# Patient Record
Sex: Female | Born: 1975
Health system: Southern US, Community
[De-identification: ages and names within clinical notes are randomized; demographics above are authoritative.]

## PROBLEM LIST (undated history)

## (undated) DIAGNOSIS — R87619 Unspecified abnormal cytological findings in specimens from cervix uteri: Secondary | ICD-10-CM

## (undated) DIAGNOSIS — IMO0002 Reserved for concepts with insufficient information to code with codable children: Secondary | ICD-10-CM

## (undated) HISTORY — PX: TONSILLECTOMY: SUR1361

## (undated) HISTORY — PX: CLAVICLE SURGERY: SHX598

---

## 2010-11-24 NOTE — L&D Delivery Note (Signed)
Delivery Note At 8:12 PM a viable female, "Lisa Pearson",  was delivered via Vaginal, Spontaneous Delivery (Presentation: Left Occiput Anterior, compound presentation with left hand).  APGAR: 8, 9; weight 6 lb 13 oz (3090 g).  FOB assisted at birth. Placenta status: Intact, Spontaneous.  Cord: 3 vessels with the following complications: Loose CAN x 1, delivered through loose cord.  Cord pH: NA  Anesthesia: Local for repair Episiotomy: None Lacerations: Left Labial;1st degree;Perineal Suture Repair: 2.0 3.0 vicryl Est. Blood Loss (mL): 200  Mom to postpartum.  Baby to nursery-stable, with skin to skin after delivery. Circumcision planned.  Nigel Bridgeman 10/03/2011, 9:03 PM

## 2011-04-03 LAB — RUBELLA ANTIBODY, IGM: Rubella: IMMUNE

## 2011-04-03 LAB — ANTIBODY SCREEN: Antibody Screen: NEGATIVE

## 2011-04-03 LAB — HIV ANTIBODY (ROUTINE TESTING W REFLEX): HIV: NONREACTIVE

## 2011-04-03 LAB — HEPATITIS B SURFACE ANTIGEN: Hepatitis B Surface Ag: NEGATIVE

## 2011-04-03 LAB — CBC: Platelets: 273 10*3/uL (ref 150–399)

## 2011-09-04 ENCOUNTER — Other Ambulatory Visit (HOSPITAL_COMMUNITY): Payer: Self-pay | Admitting: *Deleted

## 2011-09-05 ENCOUNTER — Other Ambulatory Visit (HOSPITAL_COMMUNITY): Payer: Self-pay | Admitting: *Deleted

## 2011-09-05 ENCOUNTER — Other Ambulatory Visit (HOSPITAL_COMMUNITY): Payer: Self-pay | Admitting: Obstetrics and Gynecology

## 2011-09-08 ENCOUNTER — Encounter (HOSPITAL_COMMUNITY): Payer: Self-pay

## 2011-09-08 ENCOUNTER — Ambulatory Visit (HOSPITAL_COMMUNITY)
Admission: RE | Admit: 2011-09-08 | Discharge: 2011-09-08 | Disposition: A | Discharge status: Home / Self Care | Payer: 59 | Source: Ambulatory Visit | Attending: Obstetrics and Gynecology | Admitting: Obstetrics and Gynecology

## 2011-09-08 ENCOUNTER — Other Ambulatory Visit (HOSPITAL_COMMUNITY): Payer: Self-pay | Admitting: Obstetrics and Gynecology

## 2011-09-08 DIAGNOSIS — O0930 Supervision of pregnancy with insufficient antenatal care, unspecified trimester: Secondary | ICD-10-CM | POA: Insufficient documentation

## 2011-09-08 DIAGNOSIS — O358XX0 Maternal care for other (suspected) fetal abnormality and damage, not applicable or unspecified: Secondary | ICD-10-CM | POA: Insufficient documentation

## 2011-09-09 ENCOUNTER — Ambulatory Visit (HOSPITAL_COMMUNITY): Payer: Self-pay

## 2011-09-11 LAB — STREP B DNA PROBE: GBS: POSITIVE

## 2011-09-26 ENCOUNTER — Encounter (HOSPITAL_COMMUNITY): Payer: Self-pay | Admitting: *Deleted

## 2011-09-26 ENCOUNTER — Inpatient Hospital Stay (HOSPITAL_COMMUNITY)
Admission: RE | Admit: 2011-09-26 | Discharge: 2011-09-26 | Disposition: A | Discharge status: Home / Self Care | Payer: 59 | Source: Ambulatory Visit | Attending: Obstetrics and Gynecology | Admitting: Obstetrics and Gynecology

## 2011-09-26 ENCOUNTER — Other Ambulatory Visit: Payer: Self-pay | Admitting: Obstetrics and Gynecology

## 2011-09-26 DIAGNOSIS — O99820 Streptococcus B carrier state complicating pregnancy: Secondary | ICD-10-CM

## 2011-09-26 DIAGNOSIS — O368190 Decreased fetal movements, unspecified trimester, not applicable or unspecified: Secondary | ICD-10-CM | POA: Insufficient documentation

## 2011-09-26 DIAGNOSIS — R42 Dizziness and giddiness: Secondary | ICD-10-CM | POA: Insufficient documentation

## 2011-09-26 DIAGNOSIS — O99891 Other specified diseases and conditions complicating pregnancy: Secondary | ICD-10-CM | POA: Insufficient documentation

## 2011-09-26 HISTORY — DX: Unspecified abnormal cytological findings in specimens from cervix uteri: R87.619

## 2011-09-26 HISTORY — DX: Reserved for concepts with insufficient information to code with codable children: IMO0002

## 2011-09-26 LAB — CBC
Hemoglobin: 12.3 g/dL (ref 12.0–15.0)
MCH: 31.1 pg (ref 26.0–34.0)
RBC: 3.96 MIL/uL (ref 3.87–5.11)

## 2011-09-26 LAB — COMPREHENSIVE METABOLIC PANEL
ALT: 6 U/L (ref 0–35)
Alkaline Phosphatase: 127 U/L — ABNORMAL HIGH (ref 39–117)
CO2: 23 mEq/L (ref 19–32)
Chloride: 105 mEq/L (ref 96–112)
GFR calc Af Amer: >90 mL/min (ref >90)
GFR calc non Af Amer: >90 mL/min (ref >90)
Glucose, Bld: 85 mg/dL (ref 70–99)
Potassium: 3.7 mEq/L (ref 3.5–5.1)
Sodium: 135 mEq/L (ref 135–145)
Total Bilirubin: 0.2 mg/dL — ABNORMAL LOW (ref 0.3–1.2)

## 2011-09-26 LAB — DIFFERENTIAL
Basophils Absolute: 0.1 10*3/uL (ref 0.0–0.1)
Eosinophils Absolute: 0.6 10*3/uL (ref 0.0–0.7)
Eosinophils Relative: 4 % (ref 0–5)

## 2011-09-26 LAB — URINALYSIS, ROUTINE W REFLEX MICROSCOPIC
Bilirubin Urine: NEGATIVE
Glucose, UA: NEGATIVE mg/dL
Hgb urine dipstick: NEGATIVE
Protein, ur: NEGATIVE mg/dL
Urobilinogen, UA: 0.2 mg/dL (ref 0.0–1.0)

## 2011-09-26 LAB — LACTATE DEHYDROGENASE: LDH: 147 U/L (ref 94–250)

## 2011-09-26 NOTE — Progress Notes (Signed)
Patient is here with c/o of dizziness that started this morning. She c/o lower right back pain, pain all over abdomen and right groin pain. She c/o brown vaginal discharge. C/o decreased fetal movement.

## 2011-09-26 NOTE — Progress Notes (Signed)
Written and verbal d/c instructions given and understanding voiced. 

## 2011-09-26 NOTE — Progress Notes (Signed)
Pt states, " The midwife stripped by membranes yesterday. I awoke up this morning with dizziness, nausea, and blurred vision. The baby has slowed down movements since lunch ."

## 2011-09-26 NOTE — ED Provider Notes (Signed)
History   35 yo G2P1001 at 15 4/7 weeks presented c/o dizziness and nausea since this am, irregular contractions.  Was seen yesterday in the office, with cervix 2 cm, 70 %, vtx -1, and membranes were swept.  Denies HA, visual symptoms, or epigastric pain.  No dysuria or bleeding--reported normal fetal movement to me during our phone conversation.  Pregnancy remarkable for: Positive GBS Limited prenatal care, with no visits at [facility] between 13 weeks and 34 weeks. Asthma Previous smoker   Chief Complaint  Patient presents with  . Decreased Fetal Movement  . Dizziness  . Nausea  . Blurred Vision   Hx of present pregnancy: Entered care at 13 weeks, then had no visits until 34 weeks.  Reports had "family illness" that prevented her from coming for PNV.  Husband "did [place] every week at the hospital--he is a Engineer, civil (consulting)".  Denied any complications during time away from care.  Had [place] for anatomy at 35 weeks, with questionable VSD seen.  Referred to MFM, where normal anatomy was noted.  1 hour GTT was 152, with normal 3 hr resulted.  GBS was positive at 36 weeks.   OB History    Grav Para Term Preterm Abortions TAB SAB Ect Mult Living   2 1 1  0 0 0 0 0 0 1    #1--1994. SVB.  Female infant, 6+14 at 38 weeks, after 14 hours in labor.  Labor induced, delivered in [place].  Medical history:  Hx asthma, migraines, fx collar bone 2002, fx fingers/toes wrist as child/teen, UTI. Surgical history:  Tonsils age 57. Wisdom teeth age 60, attempt at clavicle repair 2003. Family history:  PGF heart disease, HTN,;  Mother HTN, IDDM; Father polycythemia; MU COPD; PUs colon Ca, skin Ca.  Mother depression  History  Substance Use Topics  . Smoking status: Not on file  . Smokeless tobacco: Not on file  . Alcohol Use: Not on file  Previous smoker  Allergies: No Known Allergies  Prescriptions prior to admission  Medication Sig Dispense Refill  . PRENATAL VITAMINS PO Take by mouth.          ROS:  Dizziness,  particularly with position change, nausea--no vomiting.  Occasional contractions, no bleeding. Physical Exam   Blood pressure 130/69, pulse 85, temperature 98.2 F (36.8 C), temperature source Oral, resp. rate 18, height 5\' 7"  (1.702 m), weight 75.807 kg (167 lb 2 oz), last menstrual period 12/30/2010, SpO2 99.00%.  Chest clear. Heart RRR without murmur Abd Gravid, NT HEENT--ears filled with cerumen, unable to see drum (patient reports that is typical);  Throat clear.  No sinus pain on palpation.  No unusual degree of nasal congestion FHR--reassuring at present, no decels UCs q 8-9 minutes, mild. Cx 2-3, 70%, vtx -1, IBOW.  ED Course  IUP at 38 4/7 weeks Dizziness, nausea today Early vs. Prodromal labor  Plan: Check orthostatics CBC, CMP, LDH, uric acid UA Observe at present until labs reviewed.  Nigel Bridgeman, CNM 09/26/11 2140  Addendum: VSS, Afebrile. UCs irregular, mild. FHR reactive, no decels. Orthostatics stable.  Results for orders placed during the hospital encounter of 09/26/11 (from the past 24 hour(s))  URINALYSIS, ROUTINE W REFLEX MICROSCOPIC     Status: Abnormal   Collection Time   09/26/11  9:00 PM      Component Value Range   Color, Urine YELLOW  YELLOW    Appearance CLEAR  CLEAR    Specific Gravity, Urine 1.025  1.005 - 1.030    pH  6.0  5.0 - 8.0    Glucose, UA NEGATIVE  NEGATIVE (mg/dL)   Hgb urine dipstick NEGATIVE  NEGATIVE    Bilirubin Urine NEGATIVE  NEGATIVE    Ketones, ur 15 (*) NEGATIVE (mg/dL)   Protein, ur NEGATIVE  NEGATIVE (mg/dL)   Urobilinogen, UA 0.2  0.0 - 1.0 (mg/dL)   Nitrite NEGATIVE  NEGATIVE    Leukocytes, UA NEGATIVE  NEGATIVE   CBC     Status: Abnormal   Collection Time   09/26/11  9:58 PM      Component Value Range   WBC 14.4 (*) 4.0 - 10.5 (K/uL)   RBC 3.96  3.87 - 5.11 (MIL/uL)   Hemoglobin 12.3  12.0 - 15.0 (g/dL)   HCT 13.0  86.5 - 78.4 (%)   MCV 93.7  78.0 - 100.0 (fL)   MCH 31.1  26.0 - 34.0 (pg)   MCHC 33.2   30.0 - 36.0 (g/dL)   RDW 69.6  29.5 - 28.4 (%)   Platelets 225  150 - 400 (K/uL)  COMPREHENSIVE METABOLIC PANEL     Status: Abnormal   Collection Time   09/26/11  9:58 PM      Component Value Range   Sodium 135  135 - 145 (mEq/L)   Potassium 3.7  3.5 - 5.1 (mEq/L)   Chloride 105  96 - 112 (mEq/L)   CO2 23  19 - 32 (mEq/L)   Glucose, Bld 85  70 - 99 (mg/dL)   BUN 10  6 - 23 (mg/dL)   Creatinine, Ser 1.32  0.50 - 1.10 (mg/dL)   Calcium 8.6  8.4 - 44.0 (mg/dL)   Total Protein 5.5 (*) 6.0 - 8.3 (g/dL)   Albumin 2.5 (*) 3.5 - 5.2 (g/dL)   AST 12  0 - 37 (U/L)   ALT 6  0 - 35 (U/L)   Alkaline Phosphatase 127 (*) 39 - 117 (U/L)   Total Bilirubin 0.2 (*) 0.3 - 1.2 (mg/dL)   GFR calc non Af Amer >90  >90 (mL/min)   GFR calc Af Amer >90  >90 (mL/min)  DIFFERENTIAL     Status: Abnormal   Collection Time   09/26/11  9:58 PM      Component Value Range   Neutrophils Relative 64  43 - 77 (%)   Neutro Abs 9.2 (*) 1.7 - 7.7 (K/uL)   Lymphocytes Relative 24  12 - 46 (%)   Lymphs Abs 3.5  0.7 - 4.0 (K/uL)   Monocytes Relative 7  3 - 12 (%)   Monocytes Absolute 1.0  0.1 - 1.0 (K/uL)   Eosinophils Relative 4  0 - 5 (%)   Eosinophils Absolute 0.6  0.0 - 0.7 (K/uL)   Basophils Relative 1  0 - 1 (%)   Basophils Absolute 0.1  0.0 - 0.1 (K/uL)  LACTATE DEHYDROGENASE     Status: Normal   Collection Time   09/26/11  9:58 PM      Component Value Range   LD 147  94 - 250 (U/L)  URIC ACID     Status: Normal   Collection Time   09/26/11  9:58 PM      Component Value Range   Uric Acid, Serum 4.6  2.4 - 7.0 (mg/dL)   No pathophysiological findings--patient feels "OK", in NAD. Likely prodromal/latent phase labor.  D/C/d home with labor precautions. F/U as needed or as scheduled.  Nigel Bridgeman, CNM 11/2/122320

## 2011-10-03 ENCOUNTER — Encounter (HOSPITAL_COMMUNITY): Payer: Self-pay | Admitting: *Deleted

## 2011-10-03 ENCOUNTER — Inpatient Hospital Stay (HOSPITAL_COMMUNITY)
Admission: AD | Admit: 2011-10-03 | Discharge: 2011-10-05 | DRG: 775 | Disposition: A | Discharge status: Home / Self Care | Payer: 59 | Source: Ambulatory Visit | Attending: Obstetrics and Gynecology | Admitting: Obstetrics and Gynecology

## 2011-10-03 DIAGNOSIS — O99892 Other specified diseases and conditions complicating childbirth: Secondary | ICD-10-CM | POA: Diagnosis present

## 2011-10-03 DIAGNOSIS — O99820 Streptococcus B carrier state complicating pregnancy: Secondary | ICD-10-CM

## 2011-10-03 DIAGNOSIS — O0930 Supervision of pregnancy with insufficient antenatal care, unspecified trimester: Secondary | ICD-10-CM

## 2011-10-03 DIAGNOSIS — IMO0002 Reserved for concepts with insufficient information to code with codable children: Secondary | ICD-10-CM | POA: Diagnosis present

## 2011-10-03 DIAGNOSIS — Z3490 Encounter for supervision of normal pregnancy, unspecified, unspecified trimester: Secondary | ICD-10-CM

## 2011-10-03 DIAGNOSIS — Z22330 Carrier of Group B streptococcus: Secondary | ICD-10-CM

## 2011-10-03 LAB — CBC
MCH: 31.2 pg (ref 26.0–34.0)
MCV: 94.5 fL (ref 78.0–100.0)
Platelets: 242 10*3/uL (ref 150–400)
RBC: 4.33 MIL/uL (ref 3.87–5.11)
RDW: 14 % (ref 11.5–15.5)
WBC: 15.9 10*3/uL — ABNORMAL HIGH (ref 4.0–10.5)

## 2011-10-03 LAB — AMNISURE RUPTURE OF MEMBRANE (ROM) NOT AT ARMC: Amnisure ROM: POSITIVE

## 2011-10-03 LAB — RPR: RPR: NONREACTIVE

## 2011-10-03 MED ORDER — OXYCODONE-ACETAMINOPHEN 5-325 MG PO TABS: 2.0000 | ORAL_TABLET | ORAL | Status: DC | PRN

## 2011-10-03 MED ORDER — OXYTOCIN 20 UNITS IN LACTATED RINGERS INFUSION - SIMPLE
125.0000 mL/h | Freq: Once | INTRAVENOUS | Status: AC
  Administered 2011-10-03: 125 mL/h via INTRAVENOUS

## 2011-10-03 MED ORDER — PRENATAL PLUS 27-1 MG PO TABS
1.0000 | ORAL_TABLET | Freq: Every day | ORAL | Status: DC
  Administered 2011-10-04 – 2011-10-05 (×2): 1 via ORAL
  Filled 2011-10-03 (×2): qty 1

## 2011-10-03 MED ORDER — ACETAMINOPHEN 325 MG PO TABS: 650.0000 mg | ORAL_TABLET | ORAL | Status: DC | PRN

## 2011-10-03 MED ORDER — SIMETHICONE 80 MG PO CHEW: 80.0000 mg | CHEWABLE_TABLET | ORAL | Status: DC | PRN

## 2011-10-03 MED ORDER — OXYTOCIN BOLUS FROM INFUSION
500.0000 mL | Freq: Once | INTRAVENOUS | Status: AC
  Administered 2011-10-03: 500 mL via INTRAVENOUS
  Filled 2011-10-03: qty 1000
  Filled 2011-10-03: qty 500

## 2011-10-03 MED ORDER — IBUPROFEN 600 MG PO TABS
600.0000 mg | ORAL_TABLET | Freq: Four times a day (QID) | ORAL | Status: DC | PRN
  Administered 2011-10-03: 600 mg via ORAL
  Filled 2011-10-03: qty 1

## 2011-10-03 MED ORDER — TETANUS-DIPHTH-ACELL PERTUSSIS 5-2.5-18.5 LF-MCG/0.5 IM SUSP
0.5000 mL | Freq: Once | INTRAMUSCULAR | Status: AC
  Administered 2011-10-04: 0.5 mL via INTRAMUSCULAR
  Filled 2011-10-03: qty 0.5

## 2011-10-03 MED ORDER — BENZOCAINE-MENTHOL 20-0.5 % EX AERO: 1.0000 "application " | INHALATION_SPRAY | CUTANEOUS | Status: DC | PRN

## 2011-10-03 MED ORDER — WITCH HAZEL-GLYCERIN EX PADS: 1.0000 "application " | MEDICATED_PAD | CUTANEOUS | Status: DC | PRN

## 2011-10-03 MED ORDER — DIBUCAINE 1 % RE OINT: 1.0000 "application " | TOPICAL_OINTMENT | RECTAL | Status: DC | PRN

## 2011-10-03 MED ORDER — OXYTOCIN 20 UNITS IN LACTATED RINGERS INFUSION - SIMPLE
1.0000 m[IU]/min | INTRAVENOUS | Status: DC
Start: 2011-10-03 — End: 2011-10-03
  Filled 2011-10-03: qty 1000

## 2011-10-03 MED ORDER — OXYCODONE-ACETAMINOPHEN 5-325 MG PO TABS: 1.0000 | ORAL_TABLET | ORAL | Status: DC | PRN

## 2011-10-03 MED ORDER — TERBUTALINE SULFATE 1 MG/ML IJ SOLN: 0.2500 mg | Freq: Once | INTRAMUSCULAR | Status: DC | PRN

## 2011-10-03 MED ORDER — SENNOSIDES-DOCUSATE SODIUM 8.6-50 MG PO TABS
2.0000 | ORAL_TABLET | Freq: Every day | ORAL | Status: DC
  Administered 2011-10-04: 2 via ORAL

## 2011-10-03 MED ORDER — CITRIC ACID-SODIUM CITRATE 334-500 MG/5ML PO SOLN: 30.0000 mL | ORAL | Status: DC | PRN

## 2011-10-03 MED ORDER — DIPHENHYDRAMINE HCL 25 MG PO CAPS: 25.0000 mg | ORAL_CAPSULE | Freq: Four times a day (QID) | ORAL | Status: DC | PRN

## 2011-10-03 MED ORDER — ZOLPIDEM TARTRATE 10 MG PO TABS: 10.0000 mg | ORAL_TABLET | Freq: Every evening | ORAL | Status: DC | PRN

## 2011-10-03 MED ORDER — LANOLIN HYDROUS EX OINT: TOPICAL_OINTMENT | CUTANEOUS | Status: DC | PRN

## 2011-10-03 MED ORDER — IBUPROFEN 600 MG PO TABS
600.0000 mg | ORAL_TABLET | Freq: Four times a day (QID) | ORAL | Status: DC
  Administered 2011-10-04 – 2011-10-05 (×6): 600 mg via ORAL
  Filled 2011-10-03 (×6): qty 1

## 2011-10-03 MED ORDER — SODIUM CHLORIDE 0.9 % IV SOLN: 250.0000 mL | INTRAVENOUS | Status: DC

## 2011-10-03 MED ORDER — BUTORPHANOL TARTRATE 2 MG/ML IJ SOLN: 1.0000 mg | INTRAMUSCULAR | Status: DC | PRN

## 2011-10-03 MED ORDER — MAGNESIUM HYDROXIDE 400 MG/5ML PO SUSP: 30.0000 mL | ORAL | Status: DC | PRN

## 2011-10-03 MED ORDER — LIDOCAINE HCL (PF) 1 % IJ SOLN
30.0000 mL | INTRAMUSCULAR | Status: DC | PRN
  Administered 2011-10-03: 30 mL via SUBCUTANEOUS
  Filled 2011-10-03: qty 30

## 2011-10-03 MED ORDER — SODIUM CHLORIDE 0.9 % IJ SOLN: 3.0000 mL | INTRAMUSCULAR | Status: DC | PRN

## 2011-10-03 MED ORDER — PENICILLIN G POTASSIUM 5000000 UNITS IJ SOLR
2.5000 10*6.[IU] | INTRAMUSCULAR | Status: DC
  Filled 2011-10-03 (×3): qty 2.5

## 2011-10-03 MED ORDER — ONDANSETRON HCL 4 MG/2ML IJ SOLN: 4.0000 mg | INTRAMUSCULAR | Status: DC | PRN

## 2011-10-03 MED ORDER — FLEET ENEMA 7-19 GM/118ML RE ENEM: 1.0000 | ENEMA | RECTAL | Status: DC | PRN

## 2011-10-03 MED ORDER — ONDANSETRON HCL 4 MG PO TABS: 4.0000 mg | ORAL_TABLET | ORAL | Status: DC | PRN

## 2011-10-03 MED ORDER — LACTATED RINGERS IV SOLN
500.0000 mL | INTRAVENOUS | Status: DC | PRN
  Administered 2011-10-03: 1000 mL via INTRAVENOUS

## 2011-10-03 MED ORDER — DEXTROSE 5 % IV SOLN
5.0000 10*6.[IU] | Freq: Once | INTRAVENOUS | Status: AC
  Administered 2011-10-03: 5 10*6.[IU] via INTRAVENOUS
  Filled 2011-10-03: qty 5

## 2011-10-03 MED ORDER — SODIUM CHLORIDE 0.9 % IJ SOLN: 3.0000 mL | Freq: Two times a day (BID) | INTRAMUSCULAR | Status: DC

## 2011-10-03 MED ORDER — ONDANSETRON HCL 4 MG/2ML IJ SOLN: 4.0000 mg | Freq: Four times a day (QID) | INTRAMUSCULAR | Status: DC | PRN

## 2011-10-03 MED ORDER — MEASLES, MUMPS & RUBELLA VAC ~~LOC~~ INJ
0.5000 mL | INJECTION | Freq: Once | SUBCUTANEOUS | Status: DC
  Filled 2011-10-03: qty 0.5

## 2011-10-03 MED ORDER — ZOLPIDEM TARTRATE 5 MG PO TABS: 5.0000 mg | ORAL_TABLET | Freq: Every evening | ORAL | Status: DC | PRN

## 2011-10-03 NOTE — Progress Notes (Signed)
Around 0530, woke up noting some leaking of clear fluid, very small amount. Took a nap later and again noted some clear D/C when she woke up. States she is having same type of contractions she has been having for 1 week.

## 2011-10-03 NOTE — H&P (Signed)
35 yo G2P1001 at 70 4/7 weeks presented c/o questionable leaking since 5:30am, no increased contractions. Cervix was 3+ in office yesterday.   Pregnancy remarkable for:  Asthma  Limited prenatal care in mid-pregnancy  GBS positive  HPI:   Entered care at 13 weeks.  Was absent from care after that until 34 weeks, when she returned to the office, reporting "family issues" as reason for lack of care.  Reports FOB "did [place] every week at the hospital" where he is a Engineer, civil (consulting).  Had [place] at 35 weeks for anatomy, with initial views suggesting VSD.  Patient was referred to MFM, where no anomalies were noted.  GBS was positive.  Cervix has been 3 cm last 2 weeks.  OB History    Grav  Para  Term  Preterm  Abortions  TAB  SAB  Ect  Mult  Living    2  1  1   0  0  0  0  0  0  1     #1--1994. SVB of female infant, wt 6+14, 38 weeks 14 hour labor.  No pain medication. #2--Current  Past Medical History   Diagnosis  Date   .  Asthma    .  Abnormal Pap smear    Migraines, UTI, previous smoker  Past Surgical History   Procedure  Date   .  Tonsillectomy    .  Clavicle surgery     Family History   Problem  Relation  Age of Onset   .  Diabetes  Mother    .  Cancer  Father    .  Heart disease  Paternal Grandmother    Mother depression  History   Substance Use Topics   .  Smoking status:  Former Smoker     Quit date:  11/01/2010   .  Smokeless tobacco:  Never Used   .  Alcohol Use:  No    Allergies: Not on File  Prescriptions prior to admission   Medication  Sig  Dispense  Refill   .  albuterol (PROVENTIL HFA;VENTOLIN HFA) 108 (90 BASE) MCG/ACT inhaler  Inhale 2 puffs into the lungs every 6 (six) hours as needed. Pt only takes if needed while pregnant     .  Evening Primrose Oil 1000 MG CAPS  Take 1 capsule by mouth daily.     .  prenatal vitamin w/FE, FA (PRENATAL 1 + 1) 27-1 MG TABS  Take 1 tablet by mouth daily.     Marland Kitchen  DISCONTD: ALBUTEROL IN  Inhale 2 puffs into the lungs every 4 (four) hours as  needed. For asthma     .  DISCONTD: Docosahexaenoic Acid (PRENATAL DHA PO)  Take 1 tablet by mouth daily.      Physical Exam   Blood pressure 126/77, pulse 111, temperature 98.1 F (36.7 C), temperature source Oral, resp. rate 18, height 5\' 7"  (1.702 m), weight 75.297 kg (166 lb), last menstrual period 12/30/2010.   Chest clear  Heart RRR without murmur  Abd gravid, NT  Pelvic--sterile speculum exam, negative obvious pooling. Small amount clear d/c in vault. Cervix posterior, 4 cm, 80%, vtx, 0 station, feel membranes over vtx.  Ext WNL  FHR reactive, no decels  UCs irregular, mild.  Amnisure positive  Impression: IUP at 39 4/7 weeks SROM at 5:30am, clear fluid Positive GBS  Plan: Admit to Birthing Suite per consult with Dr. Estanislado Pandy Routine CNM orders GBS prophylaxis with PCN G Will anticipate starting pitocin after initial  PCN dose infused. Patient declines epidural--may use IV med prn.  Nigel Bridgeman, CNM 10/03/11 1600

## 2011-10-03 NOTE — ED Provider Notes (Signed)
History    35 yo G2P1001 at 62 4/7 weeks presented c/o questionable leaking since 5:30am, no increased contractions.  Cervix was 3+ in office yesterday.  Pregnancy remarkable for: Asthma Limited prenatal care in mid-pregnancy GBS negative   OB History    Grav Para Term Preterm Abortions TAB SAB Ect Mult Living   2 1 1  0 0 0 0 0 0 1      Past Medical History  Diagnosis Date  . Asthma   . Abnormal Pap smear     Past Surgical History  Procedure Date  . Tonsillectomy   . Clavicle surgery     Family History  Problem Relation Age of Onset  . Diabetes Mother   . Cancer Father   . Heart disease Paternal Grandmother     History  Substance Use Topics  . Smoking status: Former Smoker    Quit date: 11/01/2010  . Smokeless tobacco: Never Used  . Alcohol Use: No    Allergies: Not on File  Prescriptions prior to admission  Medication Sig Dispense Refill  . albuterol (PROVENTIL HFA;VENTOLIN HFA) 108 (90 BASE) MCG/ACT inhaler Inhale 2 puffs into the lungs every 6 (six) hours as needed. Pt only takes if needed while pregnant       . Evening Primrose Oil 1000 MG CAPS Take 1 capsule by mouth daily.        . prenatal vitamin w/FE, FA (PRENATAL 1 + 1) 27-1 MG TABS Take 1 tablet by mouth daily.        Marland Kitchen DISCONTD: ALBUTEROL IN Inhale 2 puffs into the lungs every 4 (four) hours as needed. For asthma       . DISCONTD: Docosahexaenoic Acid (PRENATAL DHA PO) Take 1 tablet by mouth daily.           Physical Exam   Blood pressure 126/77, pulse 111, temperature 98.1 F (36.7 C), temperature source Oral, resp. rate 18, height 5\' 7"  (1.702 m), weight 75.297 kg (166 lb), last menstrual period 12/30/2010.  Chest clear Heart RRR without murmur Abd gravid, NT Pelvic--sterile speculum exam, negative pooling.  Small amount mucusy d/c in vault.  Cervix posterior, 4 cm, 80%, vtx, 0 station, feel membranes over vtx. Ext WNL FHR reactive, no decels UCs irregular, mild.    ED Course  IUP at  39 4/7 weeks ? SROM Reactive FHR No evidence labor  Will await amnisure. Reviewed status with patient and husband--if amnisure negative, recommend d/c home to await onset of labor.  Nigel Bridgeman, CNM 10/03/11 1505

## 2011-10-03 NOTE — Progress Notes (Signed)
  Subjective: Reports contractions are stronger.  Wants to ambulate.  1st dose of PCN infused.  Objective: BP 117/74  Pulse 95  Temp(Src) 97.9 F (36.6 C) (Oral)  Resp 18  Ht 5\' 7"  (1.702 m)  Wt 75.297 kg (166 lb)  BMI 26.00 kg/m2  LMP 12/30/2010  Breastfeeding? Unknown      FHT:  FHR: 140 bpm, variability: moderate,  accelerations:  Present,  decelerations:  Absent UC:   regular, every 3 minutes SVE:   Dilation: 6.5 Effacement (%): 80 Station: -2 Exam by:: Manfred Arch, CNM Bulging forewaters noted.  Cervix less posterior.  Labs: Lab Results  Component Value Date   WBC 15.9* 10/03/2011   HGB 13.5 10/03/2011   HCT 40.9 10/03/2011   MCV 94.5 10/03/2011   PLT 242 10/03/2011    Assessment / Plan: Spontaneous labor, progressing normally  Will saline lock IV, allow to ambulate. Will hold pitocin at present--reevaluate after ambulation.  Nigel Bridgeman 10/03/2011, 6:17 PM

## 2011-10-03 NOTE — Plan of Care (Signed)
Problem: Consults Goal: Birthing Suites Patient Information Press F2 to bring up selections list Outcome: Completed/Met Date Met:  10/03/11  Pt 37-[redacted] weeks EGA     

## 2011-10-04 DIAGNOSIS — O0930 Supervision of pregnancy with insufficient antenatal care, unspecified trimester: Secondary | ICD-10-CM

## 2011-10-04 LAB — CBC
MCH: 31.1 pg (ref 26.0–34.0)
MCHC: 33.1 g/dL (ref 30.0–36.0)
Platelets: 219 10*3/uL (ref 150–400)
RBC: 3.66 MIL/uL — ABNORMAL LOW (ref 3.87–5.11)

## 2011-10-04 LAB — RPR: RPR Ser Ql: NONREACTIVE

## 2011-10-04 NOTE — Progress Notes (Signed)
PSYCHOSOCIAL ASSESSMENT ~ MATERNAL/CHILD Name: Lisa Pearson Age: 35 Referral Date: 10/04/11 Reason/Source: LPNC  I. FAMILY/HOME ENVIRONMENT A. Child's Legal Guardian Name: Tiki Tucciarone  DOB:   August 27, 1976                                               Age: 30                   Address: [address]                                    [place] [address]   Name: Patrici Minnis DOB:                                                  Age:                   Address: [address]                                  [place] [address]   B. Other Household Members/Support Persons Name: Aeron Sheffield Relationship: Brother                   DOB:        Name:                    Relationship:               DOB:        Name:                         Relationship:               DOB:                   Name:                   Relationship:               DOB: C.   Other Support:   II. PSYCHOSOCIAL DATA A. Information Source: MOB and FOB                    B. Event organiser         Employment:    Medicaid:      [employer]:  Private Insurance: [employer] and [employer]                            Self Pay:   Food Stamps:        WIC:       [employer]:       Paramedic Housing:       Section 8:    Maternity Care Coordination/Child Service Coordination/Early Intervention   School:  Grade:  Other:  C. Cultural and Environment Information Cultural Issues Impacting Care:  N/A III. STRENGTHS             Supportive family/friends: YES             Adequate Resources: YES             Compliance with medical plan: YES             Home prepared for Child (including basic supplies): YES             Understanding of Illness: N/A             Other:   IV. RISK FACTORS AND CURRENT PROBLEMS       No Problems Noted               Substance abuse:                                    Pt:            Family:  Family/Relationship Issues:                     Pt:            Family:             Financial Resources:                               Pt:            Family:             DSS Involvement:                                    Pt:             Family:             Knowledge/Cognitive Deficit:                   Pt:             Family:                Basic Needs(food, housing, etc.)             Pt:             Family:             Mental Illness:                                           Pt:             Family:             Abuse/Neglect/Domestic Violence           Pt:             Family:             Transportation:  Pt:              Family:             Adjustment to Illness:                               Pt:              Family:             Compliance with Treatment:                    Pt:              Family:             Housing Concerns                                   Pt:              Family:             Other:               V. SOCIAL WORK ASSESSMENT  CSW spoke to MOB and FOB about LPNC.  Both parents feel they did not need as extensive of services with [facility] and being RN's did more homecare with infant.  Both parents were upset that they were not informed previously of drug screen on infant.  No concern of postive results, they were just frustrated from not being informed.  Did not report any concerns with resources or insurance.  Plan to use [facility] pediatrics for pediatrician.  No concerns with discharge.  Please reconsult CSW if further needs arise.         VI. SOCIAL WORK PLAN (in bold)             No Further Intervention Required/ No Barriers to Discharge             Psychosocial Support and Ongoing Assessment if Needs             Patient/Family Education             Child Protective Services Report                      [place]:                       Date:             Information/Referral to [facility]             Other

## 2011-10-04 NOTE — Progress Notes (Signed)
  Post Partum Day 1 Subjective: no complaints, up ad lib without syncope, voiding, tolerating PO, + flatus, no BM  Pain well controlled with po meds BF started, baby sleepy Mood stable, bonding well Infant circ today Plans micronor for BC  Objective: Blood pressure 95/55, pulse 68, temperature 97.3 F (36.3 C), temperature source Oral, resp. rate 18, height 5\' 7"  (1.702 m), weight 75.297 kg (166 lb), last menstrual period 12/30/2010, SpO2 99.00%, unknown if currently breastfeeding.  Physical Exam:  General: alert and no distress Lungs: CTAB Heart: RRR Breasts: soft, nipples intact Lochia: appropriate Uterine Fundus: firm Perineum: labia slightly swolen, healing DVT Evaluation: No evidence of DVT seen on physical exam. Negative Homan's sign. No significant calf/ankle edema.   Basename 10/04/11 0520 10/03/11 1622  HGB 11.4* 13.5  HCT 34.4* 40.9    Assessment/Plan: Plan for discharge tomorrow, Breastfeeding, Lactation consult, Circumcision prior to discharge and Contraception micronor Stable PP Leukocytosis - no s/s infection Has had TDAP and flu vac at work, pt works at [employer]      LOS: 1 day   Lyris Hitchman M 10/04/2011, 11:17 AM

## 2011-10-05 DIAGNOSIS — IMO0002 Reserved for concepts with insufficient information to code with codable children: Secondary | ICD-10-CM | POA: Diagnosis present

## 2011-10-05 MED ORDER — NORETHINDRONE 0.35 MG PO TABS: 1.0000 | ORAL_TABLET | Freq: Every day | ORAL | Status: AC

## 2011-10-05 MED ORDER — NORETHINDRONE 0.35 MG PO TABS: 1.0000 | ORAL_TABLET | Freq: Every day | ORAL | Status: DC

## 2011-10-05 MED ORDER — IBUPROFEN 600 MG PO TABS: 600.0000 mg | ORAL_TABLET | Freq: Four times a day (QID) | ORAL | Status: AC | PRN

## 2011-10-05 NOTE — Discharge Summary (Signed)
   Obstetric Discharge Summary Reason for Admission: onset of labor Prenatal Procedures: ultrasound Intrapartum Procedures: spontaneous vaginal delivery Postpartum Procedures: none Complications-Operative and Postpartum: 2nd degree perineal laceration  Temp:  [97.3 F (36.3 C)-98.2 F (36.8 C)] 98.1 F (36.7 C) (11/11 1610) Pulse Rate:  [67-78] 76  (11/11 0638) Resp:  [18] 18  (11/11 0638) BP: (95-109)/(55-67) 109/65 mmHg (11/11 0638) Hemoglobin  Date Value Range Status  10/04/2011 11.4* 12.0-15.0 (g/dL) Final     HCT  Date Value Range Status  10/04/2011 34.4* 36.0-46.0 (%) Final    Hospital Course:  [facility] Course: Admitted on the evening of 10/03/11 after SROM at 5:30am.  Positive GBS. Progressed quickly after admission, no pitocin required, no pain medication utilized.  Delivery was performed by Nigel Bridgeman, CNM, without difficulty. Patient and baby tolerated the procedure without difficulty, with a shallow 2nd degree and a left labial minor lacerations noted, and repaired in the usual fashion.   Infant to FTN. Mother and infant then had an uncomplicated postpartum course, with breast feeding going well. Mom's physical exam was WNL, and she was discharged home in stable condition. Contraception plan was Micronor.  She received adequate benefit from po pain medications, and was discharged home with Motrin.      Discharge Diagnoses: Term Pregnancy-delivered  Discharge Information: Date: 10/05/2011 Activity: Per [facility] handout Diet: routine Medications:  Medication List  As of 10/05/2011  8:05 AM   START taking these medications         ibuprofen 600 MG tablet   Commonly known as: ADVIL,MOTRIN   Take 1 tablet (600 mg total) by mouth every 6 (six) hours as needed for pain.      norethindrone 0.35 MG tablet   Commonly known as: MICRONOR,CAMILA,ERRIN   Take 1 tablet (0.35 mg total) by mouth daily.         CONTINUE taking these medications         albuterol 108 (90  BASE) MCG/ACT inhaler   Commonly known as: PROVENTIL HFA;VENTOLIN HFA      Evening Primrose Oil 1000 MG Caps      prenatal vitamin w/FE, FA 27-1 MG Tabs         STOP taking these medications         ALBUTEROL IN      PRENATAL DHA PO          Where to get your medications    These are the prescriptions that you need to pick up.   You may get these medications from any pharmacy.         ibuprofen 600 MG tablet   norethindrone 0.35 MG tablet           Condition: stable Instructions: refer to practice specific booklet Discharge to: home Follow-up Information    Follow up with [facility] in 6 weeks.         Newborn Data: Live born  Information for the patient's newborn:  Kirstina, Leinweber [[id number]]  female ; APGAR 8/9, ; weight 6+13 ;  Home with mother.  Aireona Torelli 10/05/2011, 8:05 AM

## 2012-10-27 ENCOUNTER — Other Ambulatory Visit: Payer: Self-pay | Admitting: Obstetrics and Gynecology

## 2013-05-25 ENCOUNTER — Emergency Department (HOSPITAL_COMMUNITY)
Admission: EM | Admit: 2013-05-25 | Discharge: 2013-05-25 | Disposition: A | Discharge status: Home / Self Care | Payer: 59 | Source: Home / Self Care | Attending: Emergency Medicine | Admitting: Emergency Medicine

## 2013-05-25 ENCOUNTER — Emergency Department (INDEPENDENT_AMBULATORY_CARE_PROVIDER_SITE_OTHER): Payer: 59

## 2013-05-25 ENCOUNTER — Encounter (HOSPITAL_COMMUNITY): Payer: Self-pay | Admitting: *Deleted

## 2013-05-25 DIAGNOSIS — M659 Synovitis and tenosynovitis, unspecified: Secondary | ICD-10-CM

## 2013-05-25 DIAGNOSIS — M65979 Unspecified synovitis and tenosynovitis, unspecified ankle and foot: Secondary | ICD-10-CM

## 2013-05-25 MED ORDER — METHYLPREDNISOLONE ACETATE 40 MG/ML IJ SUSP
INTRAMUSCULAR | Status: AC
  Filled 2013-05-25: qty 5

## 2013-05-25 NOTE — ED Provider Notes (Signed)
History    CSN: [id number] Arrival date & time 05/25/13  1558  First MD Initiated Contact with Patient 05/25/13 1730     Chief Complaint  Patient presents with  . Ankle Injury   (Consider location/radiation/quality/duration/timing/severity/associated sxs/prior Treatment) HPI  37 yo wf presents today with left ankle pain.  States that 3 weeks ago she was jogging when she felt a sharp pain anterior ankle.  She has tried to conservatively treat this but not getting better.  Pain increased with walking and localized to anterior joint line and medial to mid foot. .  No mechanical symptoms or feeling of instabilty.  Has had some swelling.  Denies fever, chills.   Past Medical History  Diagnosis Date  . Asthma   . Abnormal Pap smear    Past Surgical History  Procedure Laterality Date  . Tonsillectomy    . Clavicle surgery     Family History  Problem Relation Age of Onset  . Diabetes Mother   . Cancer Father   . Leukemia Father   . Heart disease Paternal Grandmother    History  Substance Use Topics  . Smoking status: Former Smoker    Quit date: 11/01/2010  . Smokeless tobacco: Never Used  . Alcohol Use: No   OB History   Grav Para Term Preterm Abortions TAB SAB Ect Mult Living   2 2 2  0 0 0 0 0 0 2     Review of Systems  Constitutional: Negative.   HENT: Negative.   Eyes: Negative.   Respiratory: Negative.   Cardiovascular: Negative.   Gastrointestinal: Negative.   Endocrine: Negative.   Genitourinary: Negative.   Musculoskeletal: Positive for joint swelling, arthralgias and gait problem.  Skin: Negative.   Neurological: Negative.   Psychiatric/Behavioral: Negative.     Allergies  Review of patient's allergies indicates no known allergies.  Home Medications   Current Outpatient Rx  Name  Route  Sig  Dispense  Refill  . albuterol (PROVENTIL HFA;VENTOLIN HFA) 108 (90 BASE) MCG/ACT inhaler   Inhalation   Inhale 2 puffs into the lungs every 6 (six) hours as  needed. Pt only takes if needed while pregnant          . Evening Primrose Oil 1000 MG CAPS   Oral   Take 1 capsule by mouth daily.           Marland Kitchen EXPIRED: norethindrone (ORTHO MICRONOR) 0.35 MG tablet   Oral   Take 1 tablet (0.35 mg total) by mouth daily.   1 Package   11   . prenatal vitamin w/FE, FA (PRENATAL 1 + 1) 27-1 MG TABS   Oral   Take 1 tablet by mouth daily.            BP 141/84  Pulse 83  Temp(Src) 98.4 F (36.9 C) (Oral)  Resp 18  SpO2 100%  LMP 05/19/2013  Breastfeeding? No Physical Exam  Constitutional: She is oriented to person, place, and time. She appears well-developed and well-nourished.  HENT:  Head: Normocephalic and atraumatic.  Eyes: EOM are normal. Pupils are equal, round, and reactive to light.  Neck: Normal range of motion.  Pulmonary/Chest: Effort normal.  Musculoskeletal: She exhibits tenderness.  Gait antalgic.  Left ankle good rom but painful.  Mod tender across the anterior tib/talar joint line.  Somewhat tender over the atfl.  Difficult to assess ligaments due to pain.  Mild ankle swelling.  No redness or signs of infection.  Midfoot nontender.  Neurological: She is alert and oriented to person, place, and time.  Skin: Skin is warm and dry.  Psychiatric: She has a normal mood and affect.    ED Course  Injection of joint Date/Time: 05/25/2013 6:51 PM Performed by: Zonia Kief Authorized by: Leslee Home C Consent: Verbal consent obtained. Risks and benefits: risks, benefits and alternatives were discussed Consent given by: patient Patient understanding: patient states understanding of the procedure being performed Patient consent: the patient's understanding of the procedure matches consent given Procedure consent: procedure consent matches procedure scheduled Test results: test results available and properly labeled Site marked: the operative site was marked Imaging studies: imaging studies available Required items: required  blood products, implants, devices, and special equipment available Patient identity confirmed: verbally with patient Time out: Immediately prior to procedure a "time out" was called to verify the correct patient, procedure, equipment, support staff and site/side marked as required. Preparation: Patient was prepped and draped in the usual sterile fashion. Local anesthesia used: yes Anesthesia: local infiltration Local anesthetic: lidocaine 2% without epinephrine Anesthetic total: 1 ml Patient sedated: no Patient tolerance: Patient tolerated the procedure well with no immediate complications. Comments: Ankle intra-articular marcaine/depomedrol 2:1 injection was performed.  After about 5 minutes she had excellent relief with marcaine in place.    (including critical care time) Labs Reviewed - No data to display Dg Ankle Complete Left  05/25/2013   *RADIOLOGY REPORT*  Clinical Data: Medial ankle pain  LEFT ANKLE COMPLETE - 3+ VIEW  Comparison: None.  Findings: No fracture or dislocation is seen.  The ankle mortise is intact.  The base of the fifth metatarsal is unremarkable.  Mild dorsal soft tissue swelling.  IMPRESSION: No fracture or dislocation is seen.   Original Report Authenticated By: Charline Bills, M.D.   1. Synovitis of ankle         Question possible osteochondral injury.    MDM  Patient had good relief with ankle intra-articular marc/depo injection today.  Take it easy over the next couple of days.  No running.   Will f/u with dr Dion Saucier  1-2 weeks for recheck.  All questions answered.     Zonia Kief, PA-C 05/25/13 1856

## 2013-05-25 NOTE — ED Notes (Addendum)
Injured L ankle on 6/20 while running.  Felt a pain on top of foot, mid ankle.  Has been using RICE, Ibuprofen and Napronxen without relief.  Pain is constant and still limping and still swollen.

## 2013-05-25 NOTE — ED Provider Notes (Signed)
Medical screening examination/treatment/procedure(s) were performed by non-physician practitioner and as supervising physician I was immediately available for consultation/collaboration.  Leslee Home, M.D.  Reuben Likes, MD 05/25/13 [phone]

## 2013-09-06 ENCOUNTER — Emergency Department (HOSPITAL_COMMUNITY): Admission: EM | Admit: 2013-09-06 | Discharge: 2013-09-06 | Discharge status: Absent without leave | Payer: Self-pay

## 2014-09-25 ENCOUNTER — Encounter (HOSPITAL_COMMUNITY): Payer: Self-pay | Admitting: *Deleted

## 2017-01-20 ENCOUNTER — Other Ambulatory Visit: Payer: Self-pay | Admitting: Physician Assistant

## 2017-01-20 DIAGNOSIS — Z6824 Body mass index (BMI) 24.0-24.9, adult: Secondary | ICD-10-CM | POA: Diagnosis not present

## 2017-01-20 DIAGNOSIS — Z1389 Encounter for screening for other disorder: Secondary | ICD-10-CM | POA: Diagnosis not present

## 2017-01-20 DIAGNOSIS — Z0000 Encounter for general adult medical examination without abnormal findings: Secondary | ICD-10-CM | POA: Diagnosis not present

## 2017-01-20 DIAGNOSIS — Z1231 Encounter for screening mammogram for malignant neoplasm of breast: Secondary | ICD-10-CM

## 2017-02-05 ENCOUNTER — Ambulatory Visit
Admission: RE | Admit: 2017-02-05 | Discharge: 2017-02-05 | Disposition: A | Discharge status: Home / Self Care | Payer: 59 | Source: Ambulatory Visit | Attending: Physician Assistant | Admitting: Physician Assistant

## 2017-02-05 DIAGNOSIS — Z1231 Encounter for screening mammogram for malignant neoplasm of breast: Secondary | ICD-10-CM

## 2017-12-17 ENCOUNTER — Other Ambulatory Visit: Payer: Self-pay | Admitting: Physician Assistant

## 2017-12-17 DIAGNOSIS — L658 Other specified nonscarring hair loss: Secondary | ICD-10-CM | POA: Diagnosis not present

## 2017-12-17 DIAGNOSIS — N925 Other specified irregular menstruation: Secondary | ICD-10-CM | POA: Diagnosis not present

## 2017-12-17 DIAGNOSIS — Z1231 Encounter for screening mammogram for malignant neoplasm of breast: Secondary | ICD-10-CM

## 2017-12-17 DIAGNOSIS — R5383 Other fatigue: Secondary | ICD-10-CM | POA: Diagnosis not present

## 2017-12-17 DIAGNOSIS — Z8349 Family history of other endocrine, nutritional and metabolic diseases: Secondary | ICD-10-CM | POA: Diagnosis not present

## 2017-12-17 DIAGNOSIS — N926 Irregular menstruation, unspecified: Secondary | ICD-10-CM | POA: Diagnosis not present

## 2018-02-11 ENCOUNTER — Ambulatory Visit: Payer: 59

## 2018-02-16 ENCOUNTER — Ambulatory Visit
Admission: RE | Admit: 2018-02-16 | Discharge: 2018-02-16 | Disposition: A | Discharge status: Home / Self Care | Payer: 59 | Source: Ambulatory Visit | Attending: Physician Assistant | Admitting: Physician Assistant

## 2018-02-16 DIAGNOSIS — Z1231 Encounter for screening mammogram for malignant neoplasm of breast: Secondary | ICD-10-CM | POA: Diagnosis not present

## 2018-06-08 MED FILL — AZITHROMYCIN 250 MG TABLET: 250 | 5 days supply | Qty: 6 | Fill #0

## 2019-09-21 ENCOUNTER — Telehealth: Payer: Self-pay

## 2019-09-21 NOTE — Telephone Encounter (Signed)
Voicemail:  Pt called stating someone called her from our office.    There is no documentation of this call.   Laverle Patter, RN

## 2020-11-13 ENCOUNTER — Other Ambulatory Visit: Payer: Self-pay | Admitting: Internal Medicine

## 2020-11-13 DIAGNOSIS — Z1231 Encounter for screening mammogram for malignant neoplasm of breast: Secondary | ICD-10-CM

## 2020-12-25 ENCOUNTER — Other Ambulatory Visit: Payer: Self-pay | Admitting: Internal Medicine

## 2020-12-25 ENCOUNTER — Other Ambulatory Visit: Payer: Self-pay

## 2020-12-25 ENCOUNTER — Ambulatory Visit
Admission: RE | Admit: 2020-12-25 | Discharge: 2020-12-25 | Disposition: A | Discharge status: Home / Self Care | Payer: Commercial Managed Care - PPO | Source: Ambulatory Visit | Attending: Internal Medicine | Admitting: Internal Medicine

## 2020-12-25 DIAGNOSIS — Z1231 Encounter for screening mammogram for malignant neoplasm of breast: Secondary | ICD-10-CM

## 2020-12-25 DIAGNOSIS — N63 Unspecified lump in unspecified breast: Secondary | ICD-10-CM
